# Patient Record
Sex: Male | Born: 1990 | Race: White | Hispanic: No | Marital: Single | Smoking: Current every day smoker
Health system: Southern US, Community
[De-identification: ages and names within clinical notes are randomized; demographics above are authoritative.]

## PROBLEM LIST (undated history)

## (undated) DIAGNOSIS — J45909 Unspecified asthma, uncomplicated: Secondary | ICD-10-CM

## (undated) DIAGNOSIS — E119 Type 2 diabetes mellitus without complications: Secondary | ICD-10-CM

---
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
## 2017-05-29 ENCOUNTER — Emergency Department
Admission: EM | Admit: 2017-05-29 | Discharge: 2017-05-29 | Disposition: A | Discharge status: Home / Self Care | Payer: Self-pay | Attending: Emergency Medicine | Admitting: Emergency Medicine

## 2017-05-29 ENCOUNTER — Encounter: Payer: Self-pay | Admitting: Emergency Medicine

## 2017-05-29 DIAGNOSIS — F17210 Nicotine dependence, cigarettes, uncomplicated: Secondary | ICD-10-CM | POA: Insufficient documentation

## 2017-05-29 DIAGNOSIS — E119 Type 2 diabetes mellitus without complications: Secondary | ICD-10-CM | POA: Insufficient documentation

## 2017-05-29 DIAGNOSIS — J45909 Unspecified asthma, uncomplicated: Secondary | ICD-10-CM | POA: Insufficient documentation

## 2017-05-29 DIAGNOSIS — B353 Tinea pedis: Secondary | ICD-10-CM | POA: Insufficient documentation

## 2017-05-29 HISTORY — DX: Unspecified asthma, uncomplicated: J45.909

## 2017-05-29 HISTORY — DX: Type 2 diabetes mellitus without complications: E11.9

## 2017-05-29 LAB — GLUCOSE, CAPILLARY: Glucose-Capillary: 123 mg/dL — ABNORMAL HIGH (ref 65–99)

## 2017-05-29 MED ORDER — CLOTRIMAZOLE 1 % EX CREA: 1.0000 "application " | TOPICAL_CREAM | Freq: Two times a day (BID) | CUTANEOUS | 3 refills | Status: AC

## 2017-05-29 NOTE — Discharge Instructions (Addendum)
Apply over-the-counter athlete's foot powder to place in your shoes and socks.  Wash your socks and bleach.  Apply the Lotrimin twice a day.  If this is worsening you should follow-up at the acute care.  Return to the ER if you see any signs of infection.  Your glucose was 123 which is not high

## 2017-05-29 NOTE — ED Triage Notes (Signed)
Pt comes into the ED via POV c/o laceration to the bottom of the left foot.  Patient states it isn't healing and every time it closes it will reopen.  Patient in NAD at this time and ambulatory to triage at this time.

## 2017-05-29 NOTE — ED Provider Notes (Addendum)
[facility] Emergency Department Provider Note  ____________________________________________   First MD Initiated Contact with Patient 05/29/17 1743     (approximate)  I have reviewed the triage vital signs and the nursing notes.   HISTORY  Chief Complaint Laceration    HPI Cesar Barnes is a 27 y.o. male who presents to the emergency department complaining of a area of broken skin on the bottom of the left foot.  He states he is concerned because he used to have diabetes.  He is no longer been taking insulin as he does not have health insurance.  He states he is used over-the-counter treatments for the athlete's foot.  He states his socks and shoes stay wet as he works 10 hours a day.  He denies any drainage from the area he denies any fever or chills.  Past Medical History:  Diagnosis Date  . Asthma   . Diabetes mellitus without complication (HCC)     There are no active problems to display for this patient.   History reviewed. No pertinent surgical history.  Prior to Admission medications   Medication Sig Start Date End Date Taking? Authorizing Provider  clotrimazole (LOTRIMIN) 1 % cream Apply 1 application topically 2 (two) times daily. 05/29/17   Faythe Ghee, PA-C    Allergies Patient has no known allergies.  No family history on file.  Social History Social History   Tobacco Use  . Smoking status: Current Every Day Smoker    Packs/day: 0.50    Types: Cigarettes  . Smokeless tobacco: Never Used  Substance Use Topics  . Alcohol use: Yes  . Drug use: Never    Review of Systems  Constitutional: No fever/chills Eyes: No visual changes. ENT: No sore throat. Respiratory: Denies cough Genitourinary: Negative for dysuria. Musculoskeletal: Negative for back pain. Skin: Negative for rash.  Positive for broken skin on the small and fourth toes on the left foot.    ____________________________________________   PHYSICAL  EXAM:  VITAL SIGNS: ED Triage Vitals  Enc Vitals Group     BP 05/29/17 1741 135/81     Pulse Rate 05/29/17 1741 83     Resp 05/29/17 1741 18     Temp 05/29/17 1741 98.1 F (36.7 C)     Temp Source 05/29/17 1741 Oral     SpO2 05/29/17 1741 99 %     Weight 05/29/17 1732 195 lb (88.5 kg)     Height 05/29/17 1732 6' (1.829 m)     Head Circumference --      Peak Flow --      Pain Score 05/29/17 1732 3     Pain Loc --      Pain Edu? --      Excl. in GC? --     Constitutional: Alert and oriented. Well appearing and in no acute distress. Eyes: Conjunctivae are normal.  Head: Atraumatic. Nose: No congestion/rhinnorhea. Mouth/Throat: Mucous membranes are moist.   Cardiovascular: Normal rate, regular rhythm. Respiratory: Normal respiratory effort.  No retractions GU: deferred Musculoskeletal: FROM all extremities, warm and well perfused Neurologic:  Normal speech and language.  Skin:  Skin is warm, dry. No rash noted.  There are 2 cracked areas at the bottom of both toes on the left foot- left fourth and fifth toes.  There is no drainage from the area.  A lot of the skin is dry and scaly typical of athlete's foot. Psychiatric: Mood and affect are normal. Speech and behavior are normal.  ____________________________________________   LABS (all labs ordered are listed, but only abnormal results are displayed)  Labs Reviewed  GLUCOSE, CAPILLARY - Abnormal; Notable for the following components:      Result Value   Glucose-Capillary 123 (*)    All other components within normal limits  CBG MONITORING, ED   ____________________________________________   ____________________________________________  RADIOLOGY    ____________________________________________   PROCEDURES  Procedure(s) performed: No  Procedures    ____________________________________________   INITIAL IMPRESSION / ASSESSMENT AND PLAN / ED COURSE  Pertinent labs & imaging results that were available  during my care of the patient were reviewed by me and considered in my medical decision making (see chart for details).  Patient is a 27 year old male presented emergency department concerns of broken skin secondary to athlete's foot on the bottom of his left foot.  He states he used to have diabetes and used insulin.  He has not been on insulin for some time due to lack of insurance.  On physical exam the left foot shows dry cracked areas along the left fourth and fifth toes on the plantar surface.  There is no drainage or redness noted.  The patient's fingerstick glucose was 123.  Splane the physical findings and the glucose test to the patient.  Encouraged him to continue to use the over-the-counter medications.  He should be putting powder in his shoes and socks.  He should use the Lotrimin cream on the foot.  He should wash his socks, towels, and Ditter with a bleach product.  He should use a bleach product in his shower to prevent reinfecting himself.  If he is worsening he should see the acute care or a foot specialist.  He may also return to the emergency department.  The patient was asking about a pill to get rid of fungus, explained to him that this would have to be monitored with liver enzymes and that is not something we do from the emergency department.  He states he understands and will comply with our current treatment plan.  He was discharged in stable condition     As part of my medical decision making, I reviewed the following data within the electronic MEDICAL RECORD NUMBER Nursing notes reviewed and incorporated, Old chart reviewed, Notes from prior ED visits and [place] Controlled Substance Database  ____________________________________________   FINAL CLINICAL IMPRESSION(S) / ED DIAGNOSES  Final diagnoses:  Athlete's foot on left      NEW MEDICATIONS STARTED DURING THIS VISIT:  Discharge Medication List as of 05/29/2017  6:05 PM    START taking these medications   Details   clotrimazole (LOTRIMIN) 1 % cream Apply 1 application topically 2 (two) times daily., Starting Wed 05/29/2017, Print         Note:  This document was prepared using Dragon voice recognition software and may include unintentional dictation errors.    Faythe Ghee, PA-C 05/29/17 1831    Faythe Ghee, PA-C 05/29/17 1831    Sharman Cheek, MD 05/29/17 2027

## 2017-07-02 ENCOUNTER — Other Ambulatory Visit: Payer: Self-pay

## 2017-07-02 ENCOUNTER — Emergency Department: Payer: Self-pay

## 2017-07-02 ENCOUNTER — Emergency Department
Admission: EM | Admit: 2017-07-02 | Discharge: 2017-07-02 | Disposition: A | Discharge status: Home / Self Care | Payer: Self-pay | Attending: Emergency Medicine | Admitting: Emergency Medicine

## 2017-07-02 ENCOUNTER — Encounter: Payer: Self-pay | Admitting: Emergency Medicine

## 2017-07-02 DIAGNOSIS — R2242 Localized swelling, mass and lump, left lower limb: Secondary | ICD-10-CM | POA: Insufficient documentation

## 2017-07-02 DIAGNOSIS — E119 Type 2 diabetes mellitus without complications: Secondary | ICD-10-CM | POA: Insufficient documentation

## 2017-07-02 DIAGNOSIS — F17210 Nicotine dependence, cigarettes, uncomplicated: Secondary | ICD-10-CM | POA: Insufficient documentation

## 2017-07-02 DIAGNOSIS — J45909 Unspecified asthma, uncomplicated: Secondary | ICD-10-CM | POA: Insufficient documentation

## 2017-07-02 DIAGNOSIS — L03116 Cellulitis of left lower limb: Secondary | ICD-10-CM | POA: Insufficient documentation

## 2017-07-02 LAB — CBC WITH DIFFERENTIAL/PLATELET
Basophils Absolute: 0.1 10*3/uL (ref 0–0.1)
Basophils Relative: 0 %
Eosinophils Absolute: 0.1 10*3/uL (ref 0–0.7)
Eosinophils Relative: 1 %
HCT: 47.1 % (ref 40.0–52.0)
Hemoglobin: 16.1 g/dL (ref 13.0–18.0)
Lymphocytes Relative: 15 %
Lymphs Abs: 2.1 10*3/uL (ref 1.0–3.6)
MCH: 32.4 pg (ref 26.0–34.0)
MCHC: 34.2 g/dL (ref 32.0–36.0)
MCV: 94.7 fL (ref 80.0–100.0)
Monocytes Absolute: 1.4 10*3/uL — ABNORMAL HIGH (ref 0.2–1.0)
Monocytes Relative: 10 %
Neutro Abs: 10.2 10*3/uL — ABNORMAL HIGH (ref 1.4–6.5)
Neutrophils Relative %: 74 %
Platelets: 248 10*3/uL (ref 150–440)
RBC: 4.97 MIL/uL (ref 4.40–5.90)
RDW: 12.7 % (ref 11.5–14.5)
WBC: 13.9 10*3/uL — ABNORMAL HIGH (ref 3.8–10.6)

## 2017-07-02 LAB — COMPREHENSIVE METABOLIC PANEL
ALT: 21 U/L (ref 17–63)
AST: 25 U/L (ref 15–41)
Albumin: 4.2 g/dL (ref 3.5–5.0)
Alkaline Phosphatase: 65 U/L (ref 38–126)
Anion gap: 7 (ref 5–15)
BUN: 14 mg/dL (ref 6–20)
CO2: 25 mmol/L (ref 22–32)
Calcium: 9.2 mg/dL (ref 8.9–10.3)
Chloride: 107 mmol/L (ref 101–111)
Creatinine, Ser: 0.92 mg/dL (ref 0.61–1.24)
GFR calc Af Amer: >60 mL/min (ref >60)
GFR calc non Af Amer: >60 mL/min (ref >60)
Glucose, Bld: 96 mg/dL (ref 65–99)
Potassium: 3.9 mmol/L (ref 3.5–5.1)
Sodium: 139 mmol/L (ref 135–145)
Total Bilirubin: 0.8 mg/dL (ref 0.3–1.2)
Total Protein: 7.7 g/dL (ref 6.5–8.1)

## 2017-07-02 LAB — URIC ACID: Uric Acid, Serum: 6.1 mg/dL (ref 4.4–7.6)

## 2017-07-02 MED ORDER — CLINDAMYCIN HCL 300 MG PO CAPS: 300.0000 mg | ORAL_CAPSULE | Freq: Three times a day (TID) | ORAL | 0 refills | Status: AC

## 2017-07-02 NOTE — ED Provider Notes (Signed)
[facility] Emergency Department Provider Note  ____________________________________________  Time seen: Approximately 7:32 PM  I have reviewed the triage vital signs and the nursing notes.   HISTORY  Chief Complaint Ankle Pain    HPI Cesar Barnes is a 27 y.o. male presents to the emergency department with left ankle pain that has occurred intermittently for the past 3 weeks but acutely worsened 2 days ago with edema and erythema.  Patient reports that edema and erythema was much worse last night and brings pictures to the emergency department.  Patient is a daily smoker but denies prolonged immobilization, recent surgery,  prior history of DVT or knowledge of current malignancy.  Patient denies a history of gout.  Patient denies any inversion or eversion type ankle injury.  Patient denies injecting recreational drugs.  Patient has never had symptoms in the past.  He has been afebrile.  Patient reports that pain is severe enough that he can "barely walk".  His pain is currently 10 out of 10 in intensity.  Past Medical History:  Diagnosis Date  . Asthma   . Diabetes mellitus without complication (HCC)     There are no active problems to display for this patient.   History reviewed. No pertinent surgical history.  Prior to Admission medications   Medication Sig Start Date End Date Taking? Authorizing Provider  clindamycin (CLEOCIN) 300 MG capsule Take 1 capsule (300 mg total) by mouth 3 (three) times daily for 10 days. 07/02/17 07/12/17  Orvil FeilWoods, Jaclyn M, PA-C  clotrimazole (LOTRIMIN) 1 % cream Apply 1 application topically 2 (two) times daily. 05/29/17   Faythe GheeFisher, Susan W, PA-C    Allergies Patient has no known allergies.  No family history on file.  Social History Social History   Tobacco Use  . Smoking status: Current Every Day Smoker    Packs/day: 0.50    Types: Cigarettes  . Smokeless tobacco: Never Used  Substance Use Topics  . Alcohol use: Yes   . Drug use: Never     Review of Systems  Constitutional: No fever/chills Eyes: No visual changes. No discharge ENT: No upper respiratory complaints. Cardiovascular: no chest pain. Respiratory: no cough. No SOB. Gastrointestinal: No abdominal pain.  No nausea, no vomiting.  No diarrhea.  No constipation. Musculoskeletal: Patient has right ankle pain.  Skin: Patient has nonblanching erythema of the left ankle. Neurological: Negative for headaches, focal weakness or numbness.   ____________________________________________   PHYSICAL EXAM:  VITAL SIGNS: ED Triage Vitals  Enc Vitals Group     BP 07/02/17 1817 129/73     Pulse Rate 07/02/17 1817 86     Resp 07/02/17 1817 18     Temp 07/02/17 1817 98.2 F (36.8 C)     Temp Source 07/02/17 1817 Oral     SpO2 07/02/17 1817 99 %     Weight 07/02/17 1820 200 lb (90.7 kg)     Height 07/02/17 1820 6\' 1"  (1.854 m)     Head Circumference --      Peak Flow --      Pain Score 07/02/17 1820 9     Pain Loc --      Pain Edu? --      Excl. in GC? --      Constitutional: Alert and oriented. Well appearing and in no acute distress. Eyes: Conjunctivae are normal. PERRL. EOMI. Head: Atraumatic. Cardiovascular: Normal rate, regular rhythm. Normal S1 and S2.  Good peripheral circulation. Respiratory: Normal respiratory effort without tachypnea  or retractions. Lungs CTAB. Good air entry to the bases with no decreased or absent breath sounds. Musculoskeletal: Patient is able to perform full range of motion at the right ankle.  Patient has mild edema over the lateral malleolus and has tenderness over the anterior and posterior talofibular ligaments.  Palpable dorsalis pedis pulse, left. Neurologic:  Normal speech and language. No gross focal neurologic deficits are appreciated.  Skin: Patient has nonblanching erythema of the left ankle. Psychiatric: Mood and affect are normal. Speech and behavior are normal. Patient exhibits appropriate insight  and judgement.   ____________________________________________   LABS (all labs ordered are listed, but only abnormal results are displayed)  Labs Reviewed  CBC WITH DIFFERENTIAL/PLATELET - Abnormal; Notable for the following components:      Result Value   WBC 13.9 (*)    Neutro Abs 10.2 (*)    Monocytes Absolute 1.4 (*)    All other components within normal limits  COMPREHENSIVE METABOLIC PANEL  URIC ACID   ____________________________________________  EKG   ____________________________________________  RADIOLOGY I personally viewed and evaluated these images as part of my medical decision making, as well as reviewing the written report by the radiologist.  Dg Ankle Complete Left  Result Date: 07/02/2017 CLINICAL DATA:  Left ankle pain x3 weeks without known injury. EXAM: LEFT ANKLE COMPLETE - 3+ VIEW COMPARISON:  None. FINDINGS: There is no evidence of fracture, dislocation, or joint effusion. There is no evidence of arthropathy or other focal bone abnormality. Slight soft tissue swelling over the lateral malleolus. Slight induration of Kager's fat pad posteriorly. No radiopaque body. No suspicious osseous lesions. IMPRESSION: Nonspecific minimal soft tissue swelling and induration about the left ankle. No acute osseous abnormality is noted. Electronically Signed   By: Tollie Eth M.D.   On: 07/02/2017 18:46   US Venous Img Lower Unilateral Left  Result Date: 07/02/2017 CLINICAL DATA:  LEFT ankle pain for 3 weeks, swelling, question DVT EXAM: LEFT LOWER EXTREMITY VENOUS DOPPLER ULTRASOUND TECHNIQUE: Gray-scale sonography with graded compression, as well as color Doppler and duplex ultrasound were performed to evaluate the lower extremity deep venous systems from the level of the common femoral vein and including the common femoral, femoral, profunda femoral, popliteal and calf veins including the posterior tibial, peroneal and gastrocnemius veins when visible. The superficial  great saphenous vein was also interrogated. Spectral Doppler was utilized to evaluate flow at rest and with distal augmentation maneuvers in the common femoral, femoral and popliteal veins. COMPARISON:  None FINDINGS: Contralateral Common Femoral Vein: Respiratory phasicity is normal and symmetric with the symptomatic side. No evidence of thrombus. Normal compressibility. Common Femoral Vein: No evidence of thrombus. Normal compressibility, respiratory phasicity and response to augmentation. Saphenofemoral Junction: No evidence of thrombus. Normal compressibility and flow on color Doppler imaging. Profunda Femoral Vein: No evidence of thrombus. Normal compressibility and flow on color Doppler imaging. Femoral Vein: No evidence of thrombus. Normal compressibility, respiratory phasicity and response to augmentation. Popliteal Vein: No evidence of thrombus. Normal compressibility, respiratory phasicity and response to augmentation. Calf Veins: No evidence of thrombus. Normal compressibility and flow on color Doppler imaging. Superficial Great Saphenous Vein: No evidence of thrombus. Normal compressibility. Venous Reflux:  None. Other Findings:  None. IMPRESSION: No evidence of deep venous thrombosis in the LEFT lower extremity. Electronically Signed   By: Ulyses Southward M.D.   On: 07/02/2017 20:31    ____________________________________________    PROCEDURES  Procedure(s) performed:    Procedures    Medications -  No data to display   ____________________________________________   INITIAL IMPRESSION / ASSESSMENT AND PLAN / ED COURSE  Pertinent labs & imaging results that were available during my care of the patient were reviewed by me and considered in my medical decision making (see chart for details).  Review of the [place] CSRS was performed in accordance of the [facility] prior to dispensing any controlled drugs.      Assessment and Plan:  Cellulitis Patient presents to the emergency department  with erythema over the lateral aspect of the left ankle as well as pain and swelling.  Differential diagnosis included DVT, gout, cellulitis and ankle sprain.  X-ray examination of the ankle revealed soft tissue swelling but no acute fractures or bony abnormalities.  Uric acid level was within range.  Patient's white blood cell count was elevated at 14 with a left shift, increasing suspicion for cellulitis.  Venous ultrasound of the left lower extremity was noncontributory for thromboembolism.  Patient was treated empirically with clindamycin and region of cellulitis was demarcated with disposable pen.  Strict return precautions were given and patient voiced understanding regarding his return precautions.  A probiotic was recommended.  All patient questions were answered.   ____________________________________________  FINAL CLINICAL IMPRESSION(S) / ED DIAGNOSES  Final diagnoses:  Cellulitis of left ankle      NEW MEDICATIONS STARTED DURING THIS VISIT:  ED Discharge Orders        Ordered    clindamycin (CLEOCIN) 300 MG capsule  3 times daily     07/02/17 2045          This chart was dictated using voice recognition software/Dragon. Despite best efforts to proofread, errors can occur which can change the meaning. Any change was purely unintentional.    [place]asper Lloyd 07/02/17 2147    Phineas Semen, MD 07/02/17 2218

## 2017-07-02 NOTE — ED Triage Notes (Signed)
Presents with left ankle pain  States pain started about 3 weeks ago then eased off and returned 2 days ago  No swelling noted at presents  States pain is increased with ambulation

## 2020-05-03 IMAGING — US US EXTREM LOW VENOUS*L*
1 series · 13 of 24 positions shown · non-contrast
Comparison: None

CLINICAL DATA: LEFT ankle pain for 3 weeks, swelling, question DVT



[Series 1: us extrem low venous*left* · 0.08mm/px · 13 of 34 slices shown]
[im 1/34]
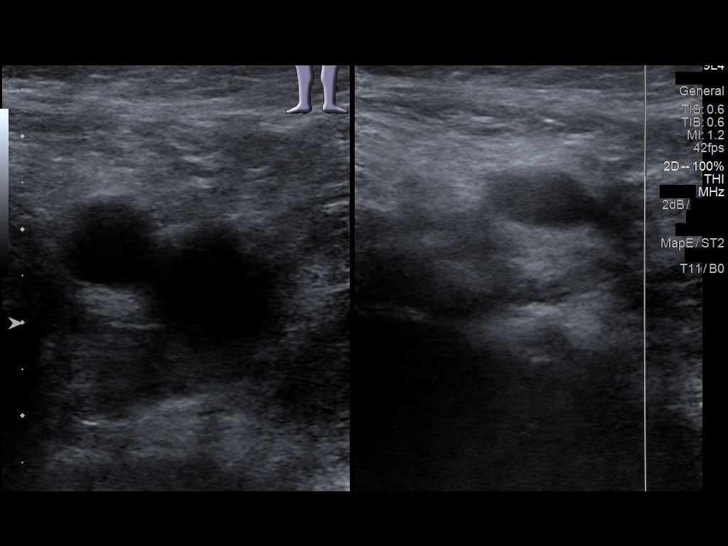
[im 3/34]
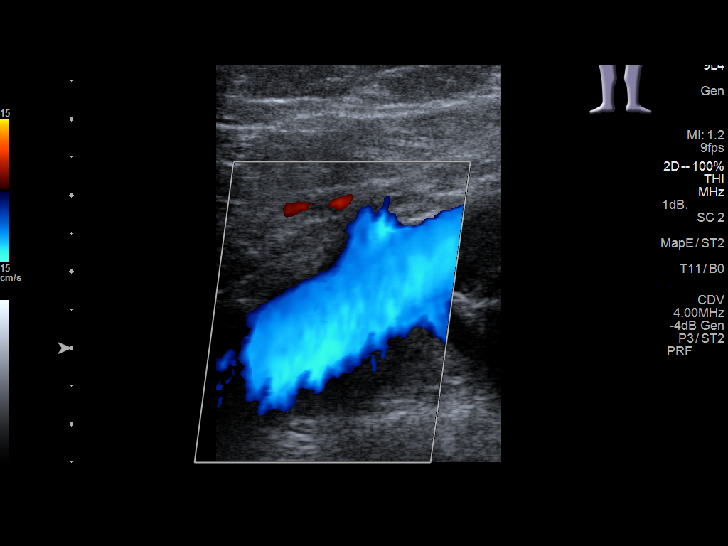
[im 6/34]
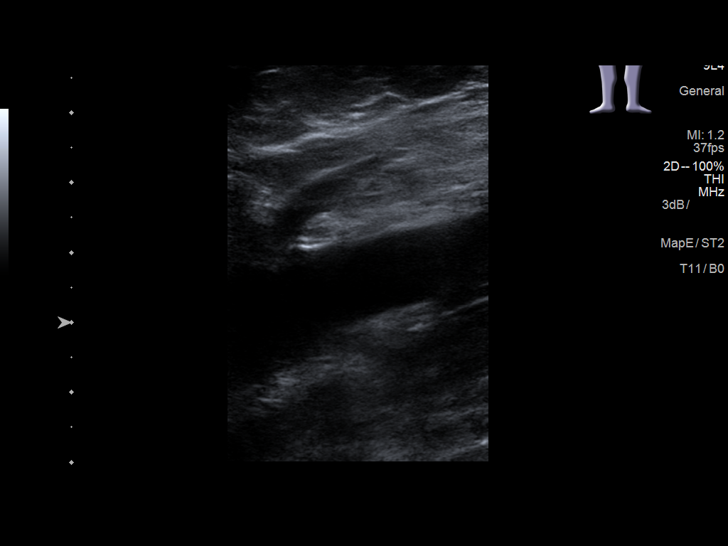
[im 9/34]
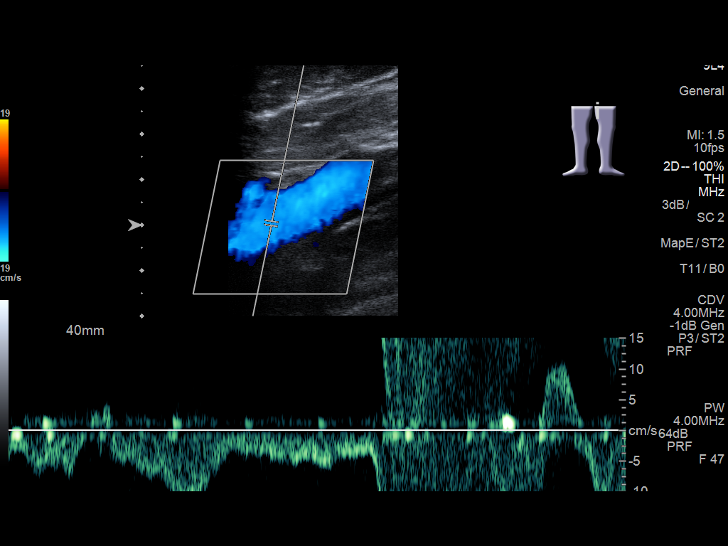
[im 12/34]
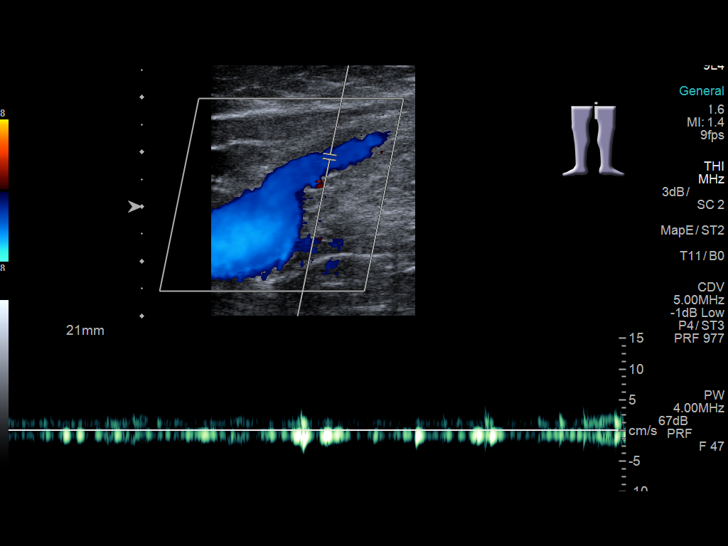
[im 15/34]
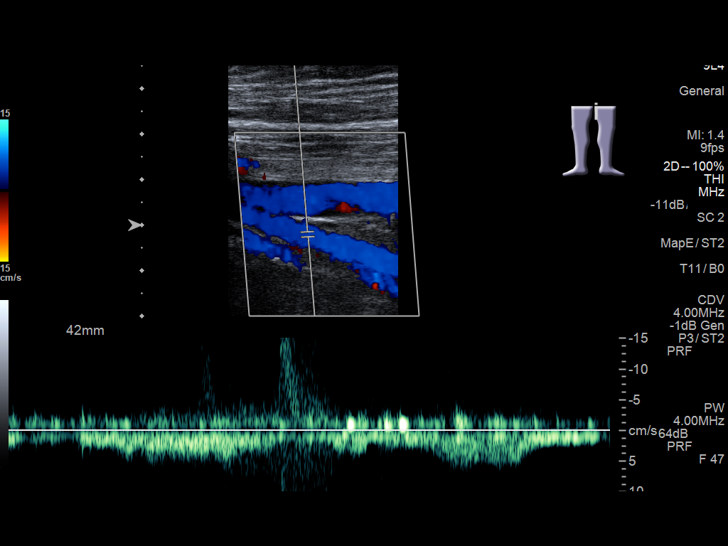
[im 18/34]
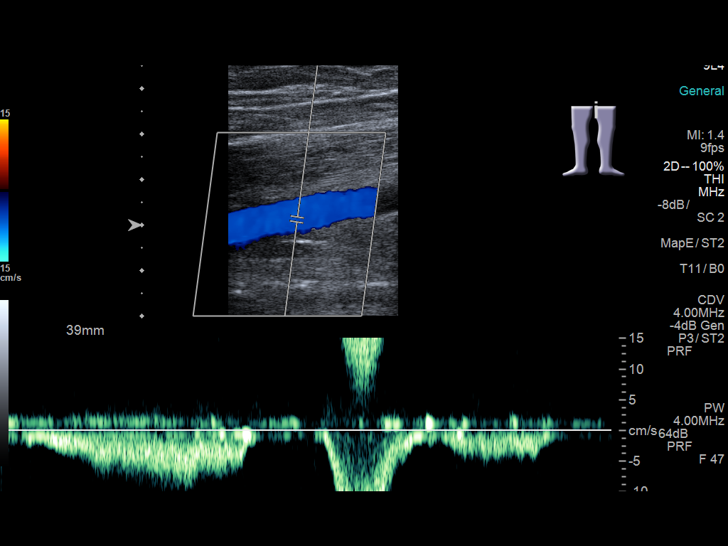
[im 19/34]
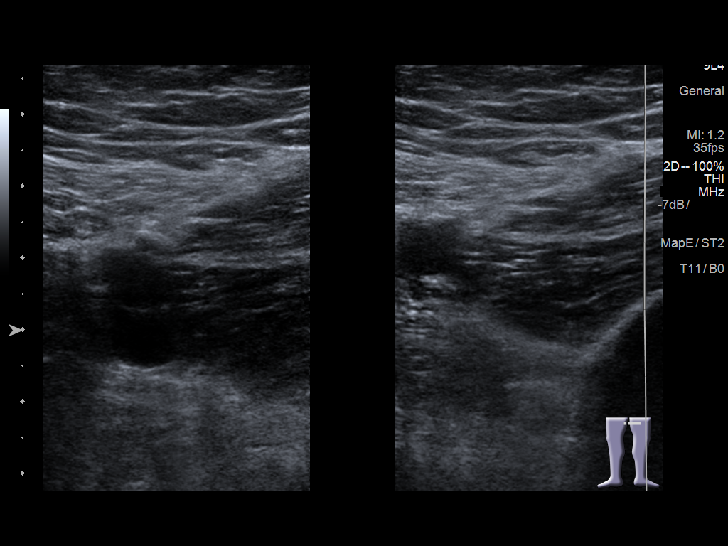
[im 22/34]
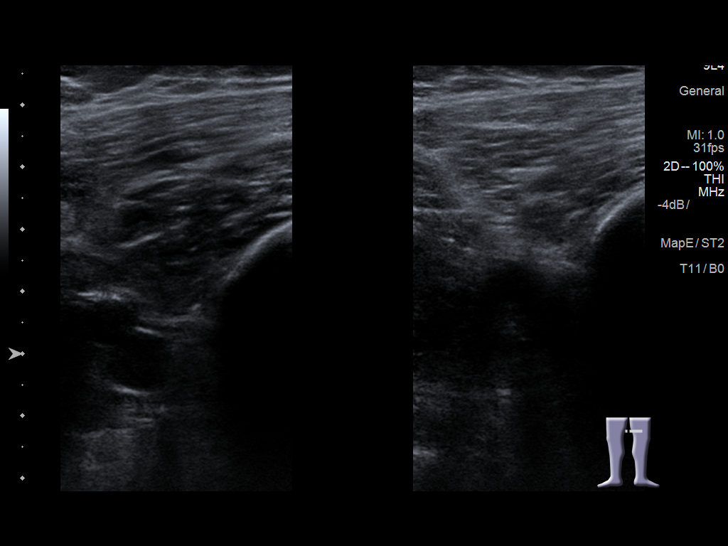
[im 25/34]
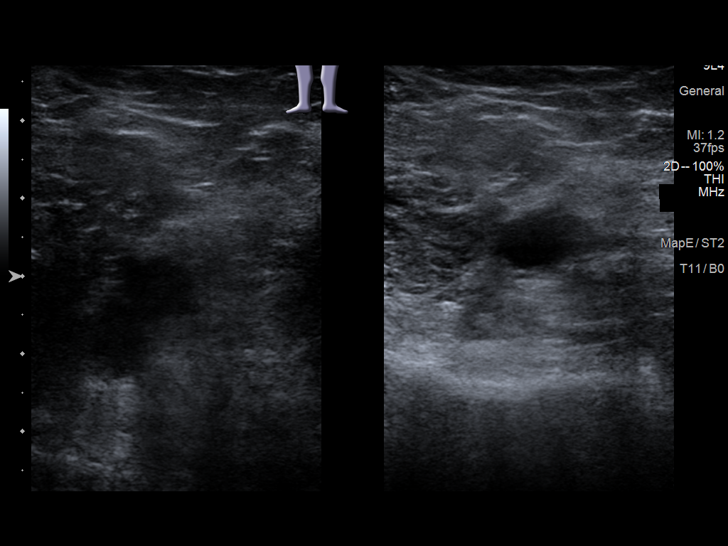
[im 28/34]
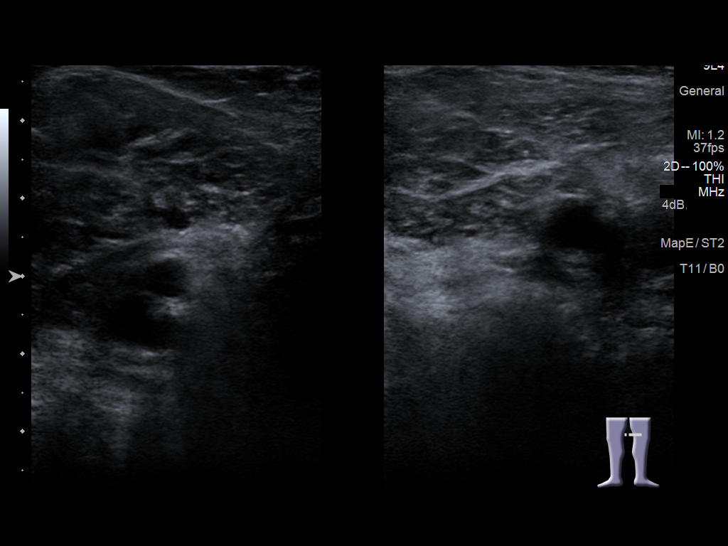
[im 31/34]
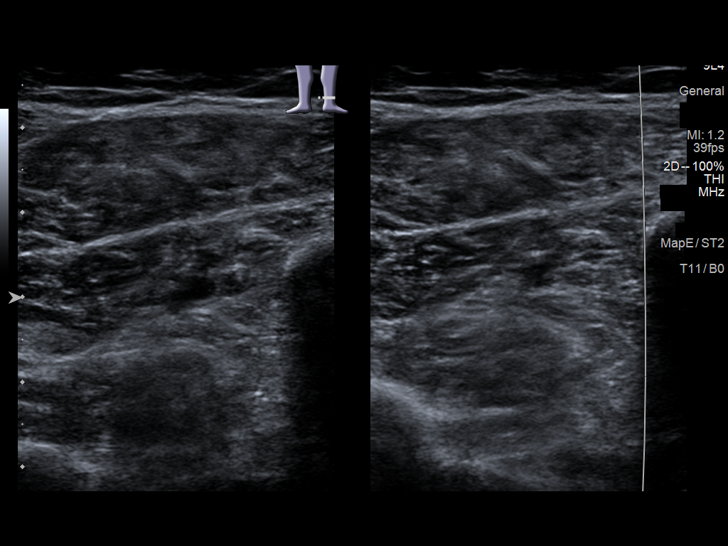
[im 34/34]
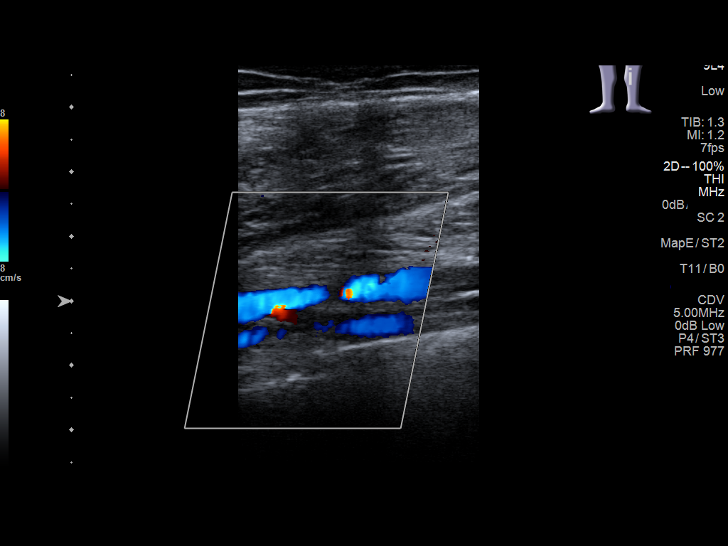

[13 of 24 positions shown; findings below may reference images not displayed]

FINDINGS: Contralateral Common Femoral Vein: Respiratory phasicity is normal
and symmetric with the symptomatic side. No evidence of thrombus.
Normal compressibility.

Common Femoral Vein: No evidence of thrombus. Normal
compressibility, respiratory phasicity and response to augmentation.

Saphenofemoral Junction: No evidence of thrombus. Normal
compressibility and flow on color Doppler imaging.

Profunda Femoral Vein: No evidence of thrombus. Normal
compressibility and flow on color Doppler imaging.

Femoral Vein: No evidence of thrombus. Normal compressibility,
respiratory phasicity and response to augmentation.

Popliteal Vein: No evidence of thrombus. Normal compressibility,
respiratory phasicity and response to augmentation.

Calf Veins: No evidence of thrombus. Normal compressibility and flow
on color Doppler imaging.

Superficial Great Saphenous Vein: No evidence of thrombus. Normal
compressibility.

Venous Reflux:  None.

Other Findings:  None.
IMPRESSION: No evidence of deep venous thrombosis in the LEFT lower extremity..
